# Patient Record
Sex: Male | Born: 1995 | Race: Black or African American | Hispanic: No | Marital: Single | Smoking: Never smoker
Health system: Southern US, Community
[De-identification: ages and names within clinical notes are randomized; demographics above are authoritative.]

## PROBLEM LIST (undated history)

## (undated) DIAGNOSIS — J45909 Unspecified asthma, uncomplicated: Secondary | ICD-10-CM

---
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
## 2016-08-12 ENCOUNTER — Emergency Department (HOSPITAL_COMMUNITY): Payer: Self-pay

## 2016-08-12 ENCOUNTER — Emergency Department (HOSPITAL_COMMUNITY)
Admission: EM | Admit: 2016-08-12 | Discharge: 2016-08-12 | Disposition: A | Discharge status: Home / Self Care | Payer: Self-pay | Attending: Emergency Medicine | Admitting: Emergency Medicine

## 2016-08-12 DIAGNOSIS — W2105XA Struck by basketball, initial encounter: Secondary | ICD-10-CM | POA: Insufficient documentation

## 2016-08-12 DIAGNOSIS — Y9367 Activity, basketball: Secondary | ICD-10-CM | POA: Insufficient documentation

## 2016-08-12 DIAGNOSIS — Y999 Unspecified external cause status: Secondary | ICD-10-CM | POA: Insufficient documentation

## 2016-08-12 DIAGNOSIS — S8254XA Nondisplaced fracture of medial malleolus of right tibia, initial encounter for closed fracture: Secondary | ICD-10-CM | POA: Insufficient documentation

## 2016-08-12 DIAGNOSIS — Y929 Unspecified place or not applicable: Secondary | ICD-10-CM | POA: Insufficient documentation

## 2016-08-12 DIAGNOSIS — S93401A Sprain of unspecified ligament of right ankle, initial encounter: Secondary | ICD-10-CM

## 2016-08-12 DIAGNOSIS — J45909 Unspecified asthma, uncomplicated: Secondary | ICD-10-CM | POA: Insufficient documentation

## 2016-08-12 MED ORDER — HYDROCODONE-ACETAMINOPHEN 5-325 MG PO TABS
1.0000 | ORAL_TABLET | Freq: Once | ORAL | Status: AC
  Administered 2016-08-12: 1 via ORAL
  Filled 2016-08-12: qty 1

## 2016-08-12 MED ORDER — IBUPROFEN 600 MG PO TABS: 600.0000 mg | ORAL_TABLET | Freq: Four times a day (QID) | ORAL | 0 refills | Status: AC | PRN

## 2016-08-12 MED ORDER — HYDROCODONE-ACETAMINOPHEN 5-325 MG PO TABS: 2.0000 | ORAL_TABLET | Freq: Four times a day (QID) | ORAL | 0 refills | Status: AC | PRN

## 2016-08-12 NOTE — ED Notes (Signed)
[facility] COUPON 190 GIVEN.

## 2016-08-12 NOTE — ED Triage Notes (Signed)
Pt reports right ankle pain from basketball- landing on someone's foot. Did not take pain meds for it. Swollen.

## 2016-08-12 NOTE — ED Provider Notes (Signed)
[facility] Provider Note   CSN: [id number] Arrival date & time: 08/12/16  1147  By signing my name below, I, Freida Busmaniana Omoyeni, attest that this documentation has been prepared under the direction and in the presence of non-physician practitioner, Roxy Horsemanobert Jhordyn Hoopingarner, PA-C. Electronically Signed: Freida Busmaniana Omoyeni, Scribe. 08/12/2016. 12:01 PM.    History   Chief Complaint Chief Complaint  Patient presents with  . Ankle Pain     The history is provided by the patient. No language interpreter was used.    HPI Comments:  Eric Morrison is a 20 y.o. male who presents to the Emergency Department complaining of moderate, constant right ankle pain since yesterday. Pt states he was playing basketball when he rolled his ankle. He reports associated swelling to the site of injury. No alleviating factors noted. Pt has no other complaints or acute injuries at this time.    Past Medical History:  Diagnosis Date  . Asthma     There are no active problems to display for this patient.   No past surgical history on file.     Home Medications    Prior to Admission medications   Medication Sig Start Date End Date Taking? Authorizing Provider  artificial tears (LACRILUBE) OINT ophthalmic ointment Apply to eye at bedtime as needed. 05/30/12   Viviano SimasLauren Robinson, NP  HYDROcodone-acetaminophen (NORCO/VICODIN) 5-325 MG per tablet 1 to 2 tabs every 4 to 6 hours as needed for pain. 07/23/13   Reuben Likesavid C Keller, MD  predniSONE (DELTASONE) 20 MG tablet 3 tabs po qd x 6 more days 05/30/12   Viviano SimasLauren Robinson, NP    Family History No family history on file.  Social History Social History  Substance Use Topics  . Smoking status: Never Smoker  . Smokeless tobacco: Not on file  . Alcohol use No     Allergies   Zithromax [azithromycin]   Review of Systems Review of Systems  Musculoskeletal: Positive for arthralgias and joint swelling.  Neurological: Negative for weakness and numbness.  All  other systems reviewed and are negative.    Physical Exam Updated Vital Signs BP 111/83 (BP Location: Right Arm)   Pulse 76   Temp 98.6 F (37 C) (Oral)   Resp 18   SpO2 97%   Physical Exam Physical Exam  Constitutional: Pt appears well-developed and well-nourished. No distress.  HENT:  Head: Normocephalic and atraumatic.  Eyes: Conjunctivae are normal.  Neck: Normal range of motion.  Cardiovascular: Normal rate, regular rhythm and intact distal pulses.   Capillary refill < 3 sec  Pulmonary/Chest: Effort normal and breath sounds normal.  Musculoskeletal:  Right ankle: Pt exhibits tenderness to palpation over the medial and lateral malleoli with obvious swelling. Pt exhibits no edema.  ROM: 3/5 limited by pain  Neurological: Pt  is alert. Coordination normal.  Sensation 5/5 Strength limited 2/2 pain  Skin: Skin is warm and dry. Pt is not diaphoretic.  No tenting of the skin  Psychiatric: Pt has a normal mood and affect.  Nursing note and vitals reviewed.   ED Treatments / Results  DIAGNOSTIC STUDIES:  Oxygen Saturation is 97% on RA, normal by my interpretation.    COORDINATION OF CARE:  12:01 PM Discussed treatment plan with pt at bedside and pt agreed to plan.  Labs (all labs ordered are listed, but only abnormal results are displayed) Labs Reviewed - No data to display  EKG  EKG Interpretation None       Radiology Dg Ankle Complete Right  Result Date: 08/12/2016 CLINICAL DATA:  Twisted rt ankle yesterday while playing basketball ,lat ankle swelling and pain EXAM: RIGHT ANKLE - COMPLETE 3+ VIEW COMPARISON:  07/23/2013 FINDINGS: There is significant lateral soft tissue swelling. There is an acute fracture of the medial malleolus, best seen on the oblique view. Fracture appears minimally displaced. IMPRESSION: 1. Significant soft tissue swelling. 2. Fracture of the medial malleolus. Electronically Signed   By: Norva PavlovElizabeth  Brown M.D.   On: 08/12/2016 12:42     Procedures Procedures (including critical care time)  Medications Ordered in ED Medications - No data to display   Initial Impression / Assessment and Plan / ED Course  I have reviewed the triage vital signs and the nursing notes.  Pertinent labs & imaging results that were available during my care of the patient were reviewed by me and considered in my medical decision making (see chart for details).  Clinical Course    Ankle sprain with medial mal fracture.  Non-weightbearing.  Ortho follow-up.   Pt advised to follow up with orthopedics. Patient given air cast and crutches while in ED, conservative therapy recommended and discussed. Patient will be discharged home & is agreeable with above plan. Returns precautions discussed. Pt appears safe for discharge.  Final Clinical Impressions(s) / ED Diagnoses   Final diagnoses:  Sprain of right ankle, unspecified ligament, initial encounter  Closed nondisplaced fracture of medial malleolus of right tibia, initial encounter    New Prescriptions New Prescriptions   HYDROCODONE-ACETAMINOPHEN (NORCO/VICODIN) 5-325 MG TABLET    Take 2 tablets by mouth every 6 (six) hours as needed.   IBUPROFEN (ADVIL,MOTRIN) 600 MG TABLET    Take 1 tablet (600 mg total) by mouth every 6 (six) hours as needed.   I personally performed the services described in this documentation, which was scribed in my presence. The recorded information has been reviewed and is accurate.       Roxy HorsemanRobert Oluwademilade Kellett, PA-C 08/12/16 1322    Charlynne Panderavid Hsienta Yao, MD 08/12/16 [phone]

## 2016-08-12 NOTE — ED Notes (Addendum)
Ortho tech in to apply air cast. PT ALREADY HAS CRUTCHES.

## 2016-08-12 NOTE — Progress Notes (Signed)
Orthopedic Tech Progress Note Patient Details:  Eric Morrison 07/13/1996 [id number]  Ortho Devices Type of Ortho Device: Ankle Air splint Ortho Device/Splint Location: rle Ortho Device/Splint Interventions: Application   [name] 08/12/2016, 1:16 PM Pt already has crutches

## 2016-08-12 NOTE — ED Notes (Signed)
Ortho tech notified.  

## 2016-08-19 ENCOUNTER — Ambulatory Visit (INDEPENDENT_AMBULATORY_CARE_PROVIDER_SITE_OTHER): Payer: Self-pay | Admitting: Orthopedic Surgery

## 2016-08-19 ENCOUNTER — Encounter (INDEPENDENT_AMBULATORY_CARE_PROVIDER_SITE_OTHER): Payer: Self-pay | Admitting: Orthopedic Surgery

## 2016-08-19 VITALS — Ht 71.0 in | Wt 155.0 lb

## 2016-08-19 DIAGNOSIS — S93411A Sprain of calcaneofibular ligament of right ankle, initial encounter: Secondary | ICD-10-CM

## 2016-08-19 NOTE — Progress Notes (Signed)
   Office Visit Note   Patient: Eric Morrison           Date of Birth: 04/10/1996           MRN: [id number] Visit Date: 08/19/2016              Requested by: Lajean Manesavid Massey, MD [address] [place], [place] [id number] PCP: Lonell FaceMASSEY,DAVID BARNETT, MD   Assessment & Plan: Visit Diagnoses:  1. Sprain of calcaneofibular ligament of right ankle, initial encounter    Grade 3 sprain right ankle with sprain of the anterior talofibular ligament calcaneofibular ligament posterior talofibular ligament as well as deltoid ligament sprain  Plan: Patient is given an ASO that he'll wear for 6 weeks. Recommended ibuprofen 600 mg 3 times a day with food for the next 2 weeks. Recommended using the ASO for playing basketball in the future. Patient has no ligamentous laxity at this time and anticipate this should heal uneventfully.  Follow-Up Instructions: Return if symptoms worsen or fail to improve.   Orders:  No orders of the defined types were placed in this encounter.  No orders of the defined types were placed in this encounter.     Procedures: No procedures performed   Clinical Data: No additional findings.   Subjective: Chief Complaint  Patient presents with  . Right Ankle - Injury, Pain    Patient presents today for follow up right ankle injury. He was playing basketball on 08/11/16 and rolled right ankle, pain was persistent and follow up with ER the following day. Diagnostic xrays obtained showing medial malleolus fracture. He is currently full weightbearing with ankle brace. He has minimal swelling and lateral ankle pain. He is currently taking hydrocodone and ibuprofen for pain.     Review of Systems   Objective: Vital Signs: Ht 5\' 11"  (1.803 m)   Wt 155 lb (70.3 kg)   BMI 21.62 kg/m   Physical Exam Patient is alert oriented no adenopathy well-dressed normal affect normal respiratory effort.  Patient has an antalgic gait. Examination the right ankle he has  minimal swelling he has good pulses he has good ankle and subtalar motion anterior drawer stable. Patient is tender to palpation over the anterior talofibular ligament calcaneofibular ligament and posterior talofibular ligament the deltoid was also tender to palpation. The medial and lateral malleolus are nontender to palpation the syndesmosis is nontender to palpation the proximal tibia and fibula are nontender to palpation. Review the radiographs show a congruent mortise no widening no evidence of fracture he does have a secondary ossification center the medial malleolus but no evidence of a fracture.  Ortho Exam  Specialty Comments:  No specialty comments available.  Imaging: No results found.   PMFS History: There are no active problems to display for this patient.  Past Medical History:  Diagnosis Date  . Asthma     No family history on file.  History reviewed. No pertinent surgical history. Social History   Occupational History  . Not on file.   Social History Main Topics  . Smoking status: Never Smoker  . Smokeless tobacco: Not on file  . Alcohol use No  . Drug use: Unknown  . Sexual activity: Not on file      [facility]

## 2016-08-31 ENCOUNTER — Telehealth (INDEPENDENT_AMBULATORY_CARE_PROVIDER_SITE_OTHER): Payer: Self-pay | Admitting: Orthopedic Surgery

## 2016-08-31 NOTE — Telephone Encounter (Signed)
Mr. Eric Morrison called saying he dropped off a form last week that needs to be signed by Dr. Lajoyce Cornersuda so he can have clearance to return to work. He's wondering if this has been filled out and faxed to his employer. Please give him a call regarding this if needed.  Pt's ph# [phone] Thank you.

## 2016-09-06 NOTE — Telephone Encounter (Signed)
Faxed form this evening and called patient to advise he can pick up tomorrow morning.

## 2016-09-06 NOTE — Telephone Encounter (Signed)
error 

## 2016-09-06 NOTE — Telephone Encounter (Signed)
Pt requesting call when this is finished. I told pt they would be available later today to pick up

## 2016-09-13 ENCOUNTER — Telehealth (INDEPENDENT_AMBULATORY_CARE_PROVIDER_SITE_OTHER): Payer: Self-pay | Admitting: Orthopedic Surgery

## 2016-09-13 NOTE — Telephone Encounter (Signed)
I called spoke with patient at the phone he states he is feeling much better. He just lifted furniture into his new apartment. He feels he can return to work without restrictions. He would like note faxed to [phone]. Note is faxed.

## 2017-06-08 ENCOUNTER — Emergency Department (HOSPITAL_COMMUNITY)
Admission: EM | Admit: 2017-06-08 | Discharge: 2017-06-08 | Disposition: A | Discharge status: Home / Self Care | Payer: No Typology Code available for payment source | Attending: Emergency Medicine | Admitting: Emergency Medicine

## 2017-06-08 ENCOUNTER — Encounter (HOSPITAL_COMMUNITY): Payer: Self-pay | Admitting: Emergency Medicine

## 2017-06-08 ENCOUNTER — Emergency Department (HOSPITAL_COMMUNITY): Payer: No Typology Code available for payment source

## 2017-06-08 DIAGNOSIS — M791 Myalgia: Secondary | ICD-10-CM | POA: Diagnosis not present

## 2017-06-08 DIAGNOSIS — J45909 Unspecified asthma, uncomplicated: Secondary | ICD-10-CM | POA: Insufficient documentation

## 2017-06-08 DIAGNOSIS — M542 Cervicalgia: Secondary | ICD-10-CM | POA: Diagnosis present

## 2017-06-08 MED ORDER — NAPROXEN 500 MG PO TABS: 500.0000 mg | ORAL_TABLET | Freq: Two times a day (BID) | ORAL | 0 refills | Status: AC

## 2017-06-08 MED ORDER — CYCLOBENZAPRINE HCL 10 MG PO TABS: 10.0000 mg | ORAL_TABLET | Freq: Two times a day (BID) | ORAL | 0 refills | Status: AC | PRN
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
## 2017-06-08 NOTE — Discharge Instructions (Signed)
You take Flexeril up to 2 times per day to help with muscle spasms and tightness. Please do not work or drive while taking this medication because it can make you sleepy. You may take one tablet of naproxen with food 2 times daily to help with pain and inflammation. You may apply ice to any areas that are sore for 15-20 minutes up to 3-4 times per day.  If you develop new or worsening symptoms, including a severe headache, visual changes, numbness, or weakness, please return to the emergency department for reevaluation.

## 2018-01-21 ENCOUNTER — Encounter (HOSPITAL_COMMUNITY): Payer: Self-pay

## 2018-01-21 ENCOUNTER — Emergency Department (HOSPITAL_COMMUNITY): Payer: Self-pay

## 2018-01-21 ENCOUNTER — Other Ambulatory Visit: Payer: Self-pay

## 2018-01-21 ENCOUNTER — Emergency Department (HOSPITAL_COMMUNITY)
Admission: EM | Admit: 2018-01-21 | Discharge: 2018-01-21 | Disposition: A | Discharge status: Home / Self Care | Payer: Self-pay | Attending: Emergency Medicine | Admitting: Emergency Medicine

## 2018-01-21 DIAGNOSIS — Y9389 Activity, other specified: Secondary | ICD-10-CM | POA: Insufficient documentation

## 2018-01-21 DIAGNOSIS — Z79899 Other long term (current) drug therapy: Secondary | ICD-10-CM | POA: Insufficient documentation

## 2018-01-21 DIAGNOSIS — Y92410 Unspecified street and highway as the place of occurrence of the external cause: Secondary | ICD-10-CM | POA: Insufficient documentation

## 2018-01-21 DIAGNOSIS — Y999 Unspecified external cause status: Secondary | ICD-10-CM | POA: Insufficient documentation

## 2018-01-21 DIAGNOSIS — J45909 Unspecified asthma, uncomplicated: Secondary | ICD-10-CM | POA: Insufficient documentation

## 2018-01-21 DIAGNOSIS — M25532 Pain in left wrist: Secondary | ICD-10-CM

## 2018-01-21 DIAGNOSIS — S161XXA Strain of muscle, fascia and tendon at neck level, initial encounter: Secondary | ICD-10-CM
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
## 2018-01-21 NOTE — Discharge Instructions (Addendum)
Use Tylenol, Motrin and ice as needed for aches and pains. For new injuries or new concerns see a clinician.

## 2018-01-21 NOTE — ED Notes (Signed)
Patient verbalized understanding of discharge instructions and denies any further needs or questions at this time. VS stable. Patient ambulatory with steady gait.  
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
